# Patient Record
Sex: Male | Born: 1970 | Race: White | Hispanic: No | Marital: Married
Health system: Southern US, Community
[De-identification: ages and names within clinical notes are randomized; demographics above are authoritative.]

---

## 2019-08-23 ENCOUNTER — Encounter (HOSPITAL_COMMUNITY): Payer: Self-pay | Admitting: Emergency Medicine

## 2019-08-23 ENCOUNTER — Emergency Department (HOSPITAL_COMMUNITY): Payer: Self-pay

## 2019-08-23 ENCOUNTER — Emergency Department (HOSPITAL_COMMUNITY)
Admission: EM | Admit: 2019-08-23 | Discharge: 2019-08-23 | Disposition: A | Discharge status: Home / Self Care | Payer: Self-pay | Attending: Emergency Medicine | Admitting: Emergency Medicine

## 2019-08-23 ENCOUNTER — Other Ambulatory Visit: Payer: Self-pay

## 2019-08-23 DIAGNOSIS — Y939 Activity, unspecified: Secondary | ICD-10-CM | POA: Insufficient documentation

## 2019-08-23 DIAGNOSIS — X58XXXA Exposure to other specified factors, initial encounter: Secondary | ICD-10-CM | POA: Insufficient documentation

## 2019-08-23 DIAGNOSIS — Y929 Unspecified place or not applicable: Secondary | ICD-10-CM | POA: Insufficient documentation

## 2019-08-23 DIAGNOSIS — S39012A Strain of muscle, fascia and tendon of lower back, initial encounter: Secondary | ICD-10-CM | POA: Insufficient documentation

## 2019-08-23 DIAGNOSIS — Y999 Unspecified external cause status: Secondary | ICD-10-CM | POA: Insufficient documentation

## 2019-08-23 LAB — CBG MONITORING, ED: Glucose-Capillary: 89 mg/dL (ref 70–99)

## 2019-08-23 MED ORDER — METHOCARBAMOL 500 MG PO TABS
500.0000 mg | ORAL_TABLET | Freq: Once | ORAL | Status: AC
  Administered 2019-08-23: 12:00:00 500 mg via ORAL
  Filled 2019-08-23: qty 1

## 2019-08-23 MED ORDER — METHYLPREDNISOLONE ACETATE 80 MG/ML IJ SUSP
40.0000 mg | Freq: Once | INTRAMUSCULAR | Status: AC
  Administered 2019-08-23: 12:00:00 40 mg via INTRAMUSCULAR
  Filled 2019-08-23: qty 1

## 2019-08-23 MED ORDER — LIDOCAINE 5 % EX PTCH
1.0000 | MEDICATED_PATCH | CUTANEOUS | Status: DC
  Administered 2019-08-23: 12:00:00 1 via TRANSDERMAL
  Filled 2019-08-23: qty 1

## 2019-08-23 MED ORDER — PREDNISONE 10 MG (21) PO TBPK: ORAL_TABLET | Freq: Every day | ORAL | 0 refills | Status: AC

## 2019-08-23 MED ORDER — METHOCARBAMOL 500 MG PO TABS: 500.0000 mg | ORAL_TABLET | Freq: Two times a day (BID) | ORAL | 0 refills | Status: AC

## 2019-08-23 MED ORDER — HYDROCODONE-ACETAMINOPHEN 5-325 MG PO TABS: 1.0000 | ORAL_TABLET | ORAL | 0 refills | Status: AC | PRN

## 2019-08-23 MED ORDER — LIDOCAINE 5 % EX PTCH: 1.0000 | MEDICATED_PATCH | CUTANEOUS | 0 refills | Status: AC

## 2019-08-23 MED ORDER — KETOROLAC TROMETHAMINE 30 MG/ML IJ SOLN
30.0000 mg | Freq: Once | INTRAMUSCULAR | Status: AC
  Administered 2019-08-23: 12:00:00 30 mg via INTRAMUSCULAR
  Filled 2019-08-23: qty 1

## 2019-08-23 MED ORDER — MELOXICAM 7.5 MG PO TABS: 7.5000 mg | ORAL_TABLET | Freq: Every day | ORAL | 0 refills | Status: AC

## 2019-08-23 NOTE — Discharge Instructions (Addendum)
Take Prednisone as prescribed and complete the full course. Take Meloxciam and Robaxin as needed as prescribed. Do NOT drive or operate machinery while taking Robaxin (muscle relaxor). Apply Lidoderm patch to right low back- if these patches are too expensive- there is an over the counter patch that may be more affordable that may work just as well. Take Norco as needed as prescribed for pain not controlled with the above medications. Do NOT driver or operate machinery while taking Norco.  Norco and Robaxin can cause constipation, take Colace or other stool softeners to prevent constipation.  Apply warm compresses to your back for 30 minutes three times daily.  Recommend light activity today- do not stay in bed or lying down for prolonged periods of time.  Follow up as planned on 12/8, return to ER for severe or concerning symptoms.

## 2019-08-23 NOTE — ED Notes (Signed)
Patient transported to X-ray 

## 2019-08-23 NOTE — ED Provider Notes (Signed)
[facility] EMERGENCY DEPARTMENT Provider Note   CSN: [id number] Arrival date & time: 08/23/19  1051     History   Chief Complaint No chief complaint on file.   HPI Jonathan Pineda is a 48 y.o. male.     48 year old male brought in by EMS from home for low back pain.  Patient states that he has had trouble with his back off and on for several years, states that his back pain flares up, he lays in bed for 3 days and the pain goes away and he resumes normal activity.  Patient states last episode started about 3 days ago, was worse than prior episodes, states he coughed last night which caused a sudden sharp severe pain to go into his right anterior lateral thigh.  Patient took a family members Tylenol 3 without improvement in his pain, has not taken anything else.  Patient states he is unable to walk due to pain in his leg.  Patient denies fevers, IV drug use, falls or injuries, abdominal pain, groin numbness, loss of bowel or bladder control.  Patient has been seen by a chiropractor and massage therapy in the past without improvement in his symptoms, has not been to see a doctor for his back in the past however is scheduled to be seen by PCP on December 8 for referral to spine.  No other complaints or concerns.     History reviewed. No pertinent past medical history.  There are no active problems to display for this patient.   History reviewed. No pertinent surgical history.      Home Medications    Prior to Admission medications   Medication Sig Start Date End Date Taking? Authorizing Provider  HYDROcodone-acetaminophen (NORCO/VICODIN) 5-325 MG tablet Take 1 tablet by mouth every 4 (four) hours as needed for moderate pain. 08/23/19   Jeannie Fend, PA-C  lidocaine (LIDODERM) 5 % Place 1 patch onto the skin daily. Remove & Discard patch within 12 hours or as directed by MD 08/23/19   Jeannie Fend, PA-C  meloxicam (MOBIC) 7.5 MG tablet Take 1 tablet  (7.5 mg total) by mouth daily for 10 days. 08/23/19 09/02/19  Jeannie Fend, PA-C  methocarbamol (ROBAXIN) 500 MG tablet Take 1 tablet (500 mg total) by mouth 2 (two) times daily. 08/23/19   Jeannie Fend, PA-C  predniSONE (STERAPRED UNI-PAK 21 TAB) 10 MG (21) TBPK tablet Take by mouth daily. Take 6 tabs by mouth daily  for 2 days, then 5 tabs for 2 days, then 4 tabs for 2 days, then 3 tabs for 2 days, 2 tabs for 2 days, then 1 tab by mouth daily for 2 days 08/23/19   Jeannie Fend, PA-C    Family History History reviewed. No pertinent family history.  Social History Social History   Tobacco Use  . Smoking status: Not on file  Substance Use Topics  . Alcohol use: Not on file  . Drug use: Not on file     Allergies   Patient has no known allergies.   Review of Systems Review of Systems  Constitutional: Negative for fever.  Gastrointestinal: Negative for abdominal pain, constipation and diarrhea.  Genitourinary: Negative for decreased urine volume and difficulty urinating.  Musculoskeletal: Positive for back pain and gait problem.  Skin: Negative for rash and wound.  Allergic/Immunologic: Negative for immunocompromised state.  Neurological: Negative for weakness and numbness.  Hematological: Negative for adenopathy. Does not bruise/bleed easily.  Psychiatric/Behavioral: Negative for confusion.  All other systems reviewed and are negative.    Physical Exam Updated Vital Signs BP 95/63   Pulse 63   Temp 98 F (36.7 C) (Oral)   Resp 19   Ht 5\' 9"  (1.753 m)   Wt 59 kg   SpO2 99%   BMI 19.20 kg/m   Physical Exam Vitals signs and nursing note reviewed.  Constitutional:      General: He is not in acute distress.    Appearance: He is well-developed. He is not diaphoretic.  HENT:     Head: Normocephalic and atraumatic.  Cardiovascular:     Pulses: Normal pulses.  Pulmonary:     Effort: Pulmonary effort is normal.  Abdominal:     Palpations: Abdomen is soft.      Tenderness: There is no abdominal tenderness.  Musculoskeletal:        General: Tenderness present. No swelling, deformity or signs of injury.     Lumbar back: He exhibits decreased range of motion and tenderness. He exhibits no bony tenderness.       Back:     Right lower leg: No edema.     Left lower leg: No edema.  Skin:    General: Skin is warm and dry.     Findings: No erythema or rash.  Neurological:     Mental Status: He is alert and oriented to person, place, and time.     Sensory: Sensation is intact. No sensory deficit.     Motor: No weakness.     Deep Tendon Reflexes: Reflexes normal. Babinski sign absent on the right side. Babinski sign absent on the left side.     Reflex Scores:      Patellar reflexes are 2+ on the right side and 2+ on the left side.      Achilles reflexes are 1+ on the right side and 1+ on the left side. Psychiatric:        Behavior: Behavior normal.      ED Treatments / Results  Labs (all labs ordered are listed, but only abnormal results are displayed) Labs Reviewed  CBG MONITORING, ED    EKG None  Radiology Dg Lumbar Spine Complete  Result Date: 08/23/2019 CLINICAL DATA:  Progressive low back pain EXAM: LUMBAR SPINE - COMPLETE 4+ VIEW COMPARISON:  None. FINDINGS: Five lumbar type vertebral segments. Vertebral body heights and alignment are maintained. No fracture identified. Mild intervertebral disc height loss at L3-4. The remaining intervertebral disc spaces are relatively preserved. Mild degenerative endplate changes. Minimal lower lumbar facet arthrosis. IMPRESSION: Mild lumbar spondylosis.  No acute findings. Electronically Signed   By: Duanne GuessNicholas  Plundo M.D.   On: 08/23/2019 11:48    Procedures Procedures (including critical care time)  Medications Ordered in ED Medications  lidocaine (LIDODERM) 5 % 1 patch (1 patch Transdermal Patch Applied 08/23/19 1147)  methylPREDNISolone acetate (DEPO-MEDROL) injection 40 mg (40 mg  Intramuscular Given 08/23/19 1148)  methocarbamol (ROBAXIN) tablet 500 mg (500 mg Oral Given 08/23/19 1147)  ketorolac (TORADOL) 30 MG/ML injection 30 mg (30 mg Intramuscular Given 08/23/19 1147)     Initial Impression / Assessment and Plan / ED Course  I have reviewed the triage vital signs and the nursing notes.  Pertinent labs & imaging results that were available during my care of the patient were reviewed by me and considered in my medical decision making (see chart for details).  Clinical Course as of Aug 22 1322  Fri Aug 23, 2019  1320 48yo male  right low back pain, radiates to right thigh, worse after coughing last night. No red flag symptoms, abdomen soft and non tender, leg strength, pulses, reflexes symmetric. Pain with palpation right lumbar paraspinous, no bony tenderness. Suspect herniated disc, XR without significant findings. Pain has improved with lidoderm patch, depomedrol, toradol. Patient is ambulatory to the bathroom without assistance.  Will dc with prednisone, meloxicam, robaxin, lidoderm, also given rx for Norco for pain not controlled with above medications, recommend warm compresses. Follow up scheduled for 12/8 with PCP, discussed may need PT vs advanced imaging if not improving with conservative treatment.    [LM]    Clinical Course User Index [LM] Tacy Learn, PA-C      Final Clinical Impressions(s) / ED Diagnoses   Final diagnoses:  Strain of lumbar region, initial encounter    ED Discharge Orders         Ordered    methocarbamol (ROBAXIN) 500 MG tablet  2 times daily     08/23/19 1313    meloxicam (MOBIC) 7.5 MG tablet  Daily     08/23/19 1313    lidocaine (LIDODERM) 5 %  Every 24 hours     08/23/19 1313    predniSONE (STERAPRED UNI-PAK 21 TAB) 10 MG (21) TBPK tablet  Daily     08/23/19 1313    HYDROcodone-acetaminophen (NORCO/VICODIN) 5-325 MG tablet  Every 4 hours PRN     08/23/19 1313           Tacy Learn, PA-C 08/23/19 1323     Lajean Saver, MD 08/23/19 [phone]

## 2019-08-23 NOTE — ED Triage Notes (Addendum)
Pt here VIA EMS with C/O right lower back pain, sharp in nature with radiation down right leg.  Pt states he has been having this pain intermittently for 10 years and normally after a few days rest, pain goes away.  Pt notes he coughed last night which worsened pain.  Pt has been taking tylenol and using CBD oil with no relief.   EMS vitals:  BP 128/88 HR 92 Sp02 98% RA

## 2019-08-23 NOTE — ED Notes (Signed)
Patient verbalizes understanding of discharge instructions. Opportunity for questioning and answers were provided. Armband removed by staff, pt discharged from ED via wheelchair to home.  

## 2020-04-13 IMAGING — CR DG LUMBAR SPINE COMPLETE 4+V
5 series · 5 of 5 positions shown · non-contrast
Comparison: None.

CLINICAL DATA: Progressive low back pain

EXAM:
LUMBAR SPINE - COMPLETE 4+ VIEW

[l-spine ap]
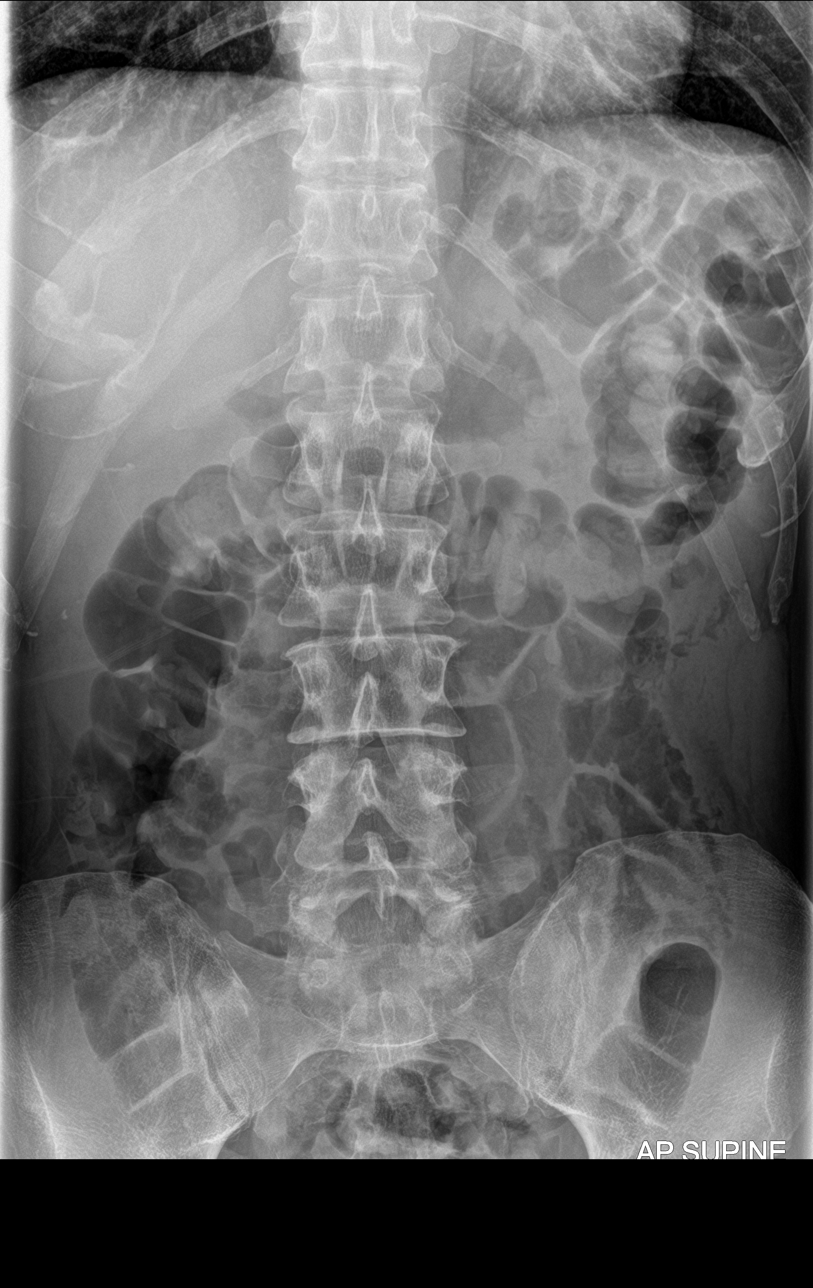

[l-spine obl (1 of 2)]
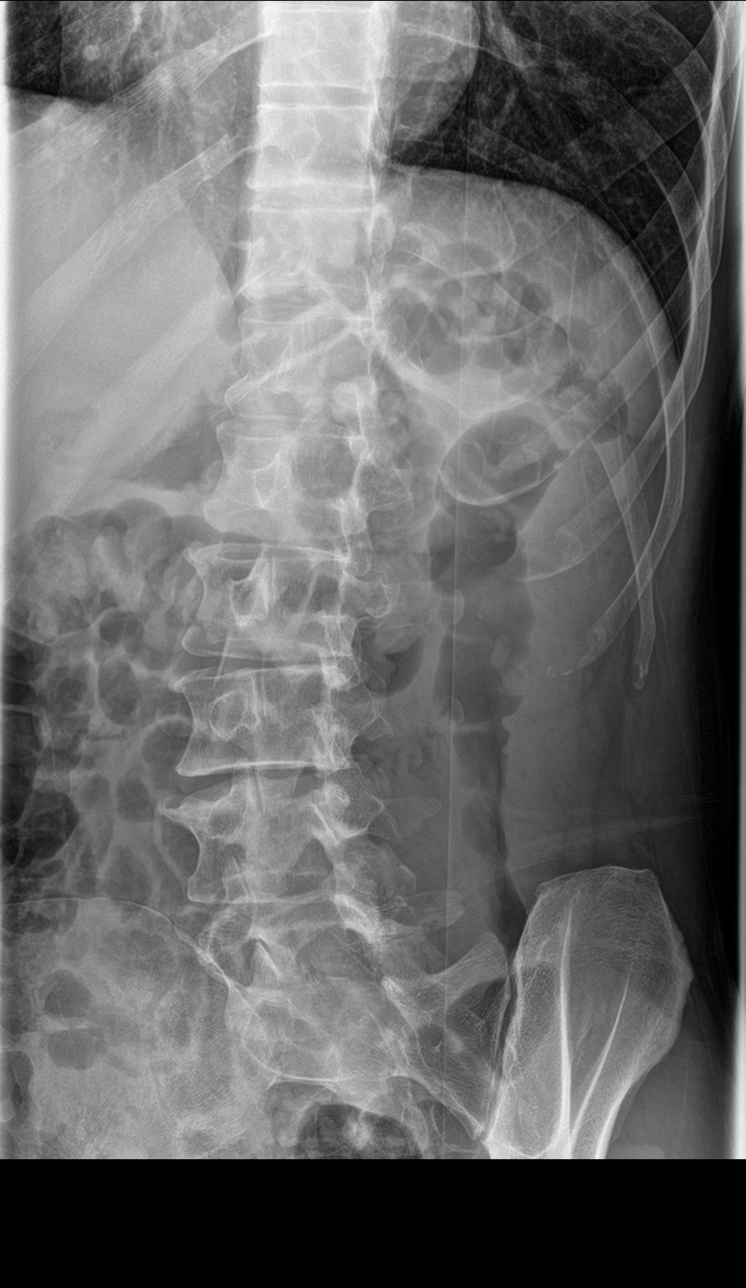

[l-spine obl (2 of 2)]
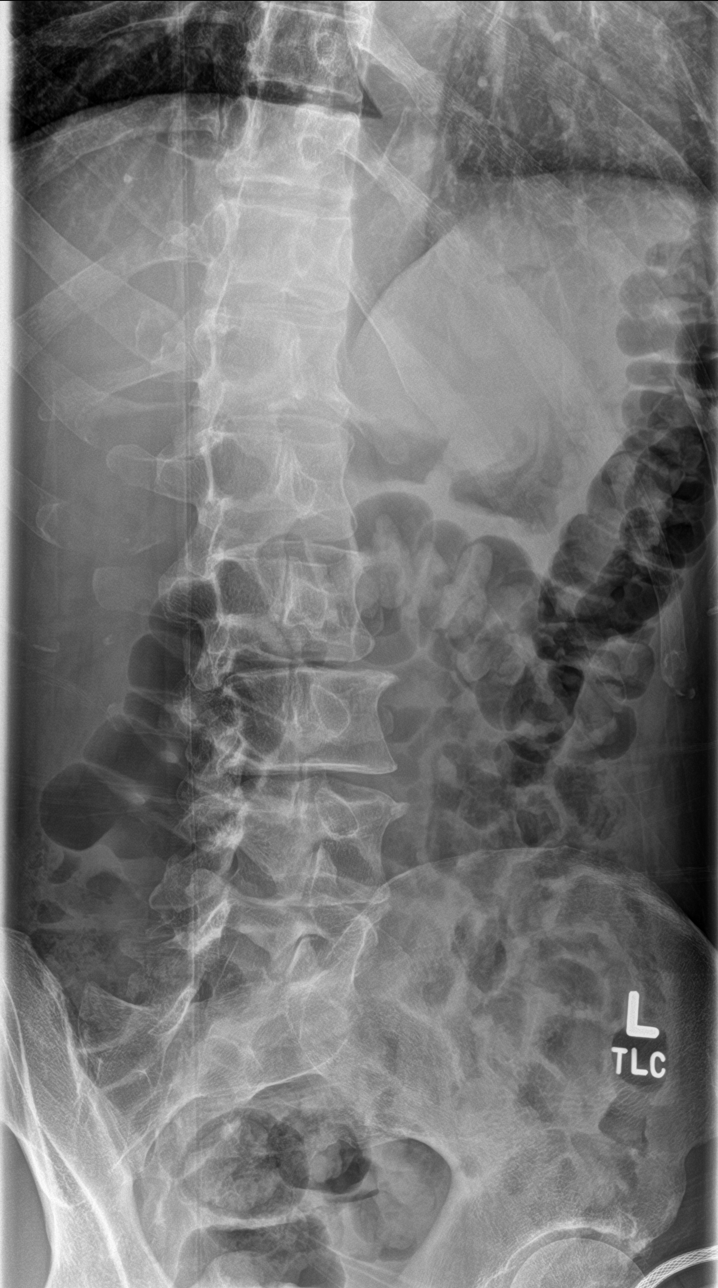

[l-spine lat]
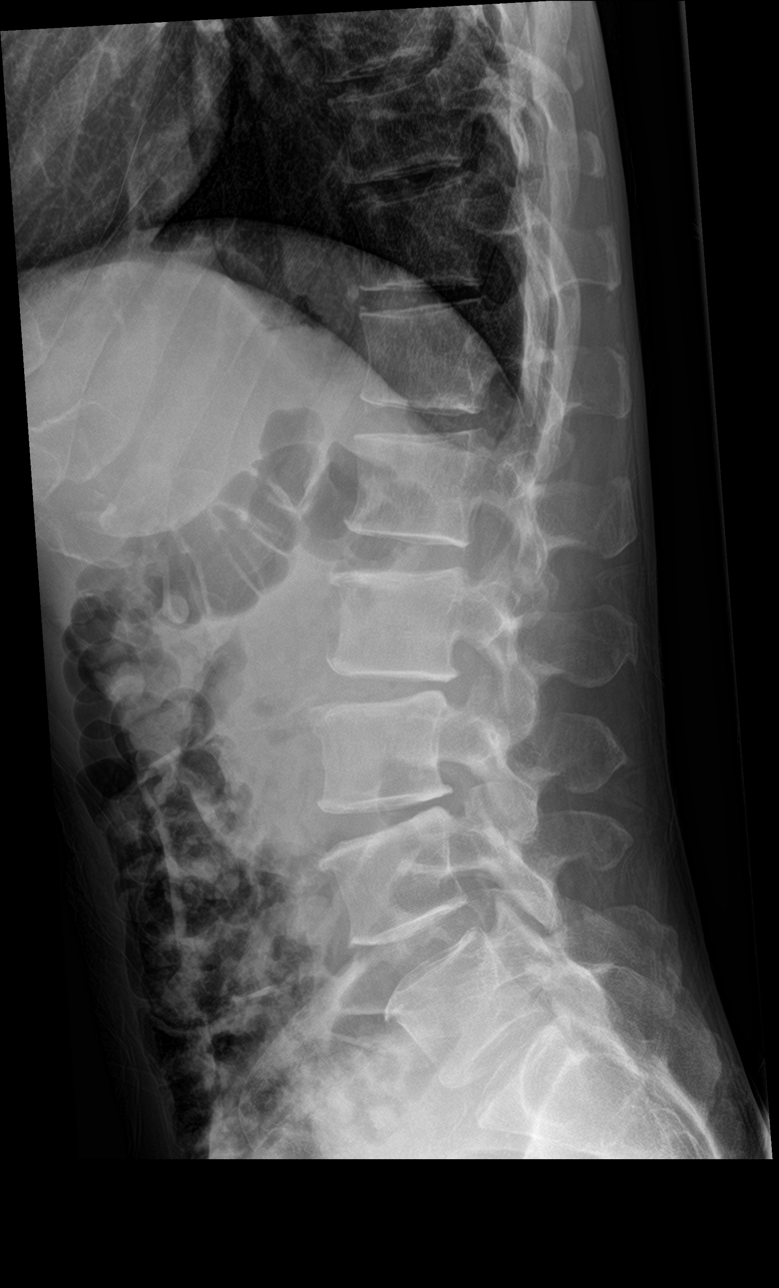

[l-spine spot]
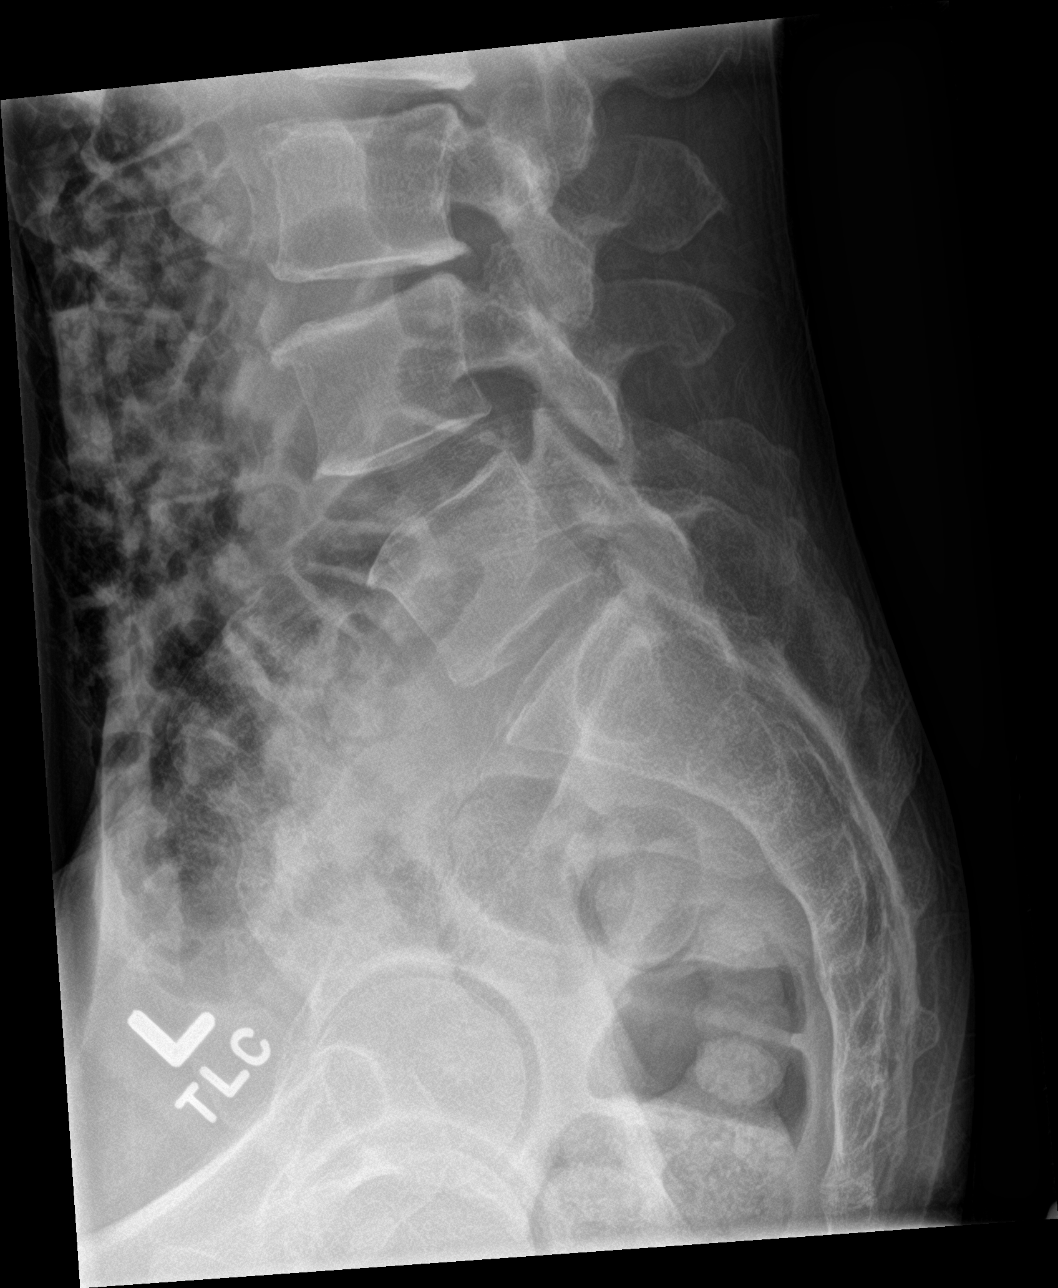

[5 of 5 positions shown; findings below may reference images not displayed]

FINDINGS: Five lumbar type vertebral segments. Vertebral body heights and
alignment are maintained. No fracture identified. Mild
intervertebral disc height loss at L3-4. The remaining
intervertebral disc spaces are relatively preserved. Mild
degenerative endplate changes. Minimal lower lumbar facet arthrosis.
IMPRESSION: Mild lumbar spondylosis.  No acute findings.

## 2022-05-04 ENCOUNTER — Other Ambulatory Visit: Payer: Self-pay | Admitting: Family Medicine

## 2022-05-04 DIAGNOSIS — E079 Disorder of thyroid, unspecified: Secondary | ICD-10-CM

## 2022-05-10 ENCOUNTER — Ambulatory Visit
Admission: RE | Admit: 2022-05-10 | Discharge: 2022-05-10 | Disposition: A | Discharge status: Home / Self Care | Payer: No Typology Code available for payment source | Source: Ambulatory Visit | Attending: Family Medicine | Admitting: Family Medicine

## 2022-05-10 DIAGNOSIS — E079 Disorder of thyroid, unspecified: Secondary | ICD-10-CM
